# Patient Record
Sex: Female | Born: 1940 | Race: Black or African American | Hispanic: No | Marital: Single
Health system: Southern US, Community
[De-identification: ages and names within clinical notes are randomized; demographics above are authoritative.]

## PROBLEM LIST (undated history)

## (undated) DIAGNOSIS — I10 Essential (primary) hypertension: Secondary | ICD-10-CM

## (undated) HISTORY — PX: ABDOMINAL SURGERY: SHX537

---

## 2016-08-31 ENCOUNTER — Emergency Department (HOSPITAL_COMMUNITY): Payer: Self-pay

## 2016-08-31 ENCOUNTER — Encounter (HOSPITAL_COMMUNITY): Payer: Self-pay | Admitting: Emergency Medicine

## 2016-08-31 ENCOUNTER — Emergency Department (HOSPITAL_COMMUNITY)
Admission: EM | Admit: 2016-08-31 | Discharge: 2016-08-31 | Disposition: A | Discharge status: Home / Self Care | Payer: Self-pay | Attending: Emergency Medicine | Admitting: Emergency Medicine

## 2016-08-31 DIAGNOSIS — R1030 Lower abdominal pain, unspecified: Secondary | ICD-10-CM | POA: Insufficient documentation

## 2016-08-31 DIAGNOSIS — Z79899 Other long term (current) drug therapy: Secondary | ICD-10-CM | POA: Insufficient documentation

## 2016-08-31 DIAGNOSIS — I10 Essential (primary) hypertension: Secondary | ICD-10-CM | POA: Insufficient documentation

## 2016-08-31 HISTORY — DX: Essential (primary) hypertension: I10

## 2016-08-31 LAB — COMPREHENSIVE METABOLIC PANEL
ALK PHOS: 82 U/L (ref 38–126)
ALT: 18 U/L (ref 14–54)
AST: 27 U/L (ref 15–41)
Albumin: 4.1 g/dL (ref 3.5–5.0)
Anion gap: 10 (ref 5–15)
BILIRUBIN TOTAL: 1 mg/dL (ref 0.3–1.2)
BUN: 23 mg/dL — AB (ref 6–20)
CALCIUM: 9.8 mg/dL (ref 8.9–10.3)
CHLORIDE: 108 mmol/L (ref 101–111)
CO2: 24 mmol/L (ref 22–32)
CREATININE: 1.34 mg/dL — AB (ref 0.44–1.00)
GFR, EST AFRICAN AMERICAN: 43 mL/min — AB (ref >60)
GFR, EST NON AFRICAN AMERICAN: 37 mL/min — AB (ref >60)
Glucose, Bld: 98 mg/dL (ref 65–99)
Potassium: 3.8 mmol/L (ref 3.5–5.1)
Sodium: 142 mmol/L (ref 135–145)
TOTAL PROTEIN: 8 g/dL (ref 6.5–8.1)

## 2016-08-31 LAB — CBC WITH DIFFERENTIAL/PLATELET
BASOS ABS: 0 10*3/uL (ref 0.0–0.1)
Basophils Relative: 0 %
Eosinophils Absolute: 0.3 10*3/uL (ref 0.0–0.7)
Eosinophils Relative: 3 %
HEMATOCRIT: 40.9 % (ref 36.0–46.0)
HEMOGLOBIN: 13.5 g/dL (ref 12.0–15.0)
LYMPHS ABS: 3.1 10*3/uL (ref 0.7–4.0)
LYMPHS PCT: 28 %
MCH: 27.9 pg (ref 26.0–34.0)
MCHC: 33 g/dL (ref 30.0–36.0)
MCV: 84.5 fL (ref 78.0–100.0)
Monocytes Absolute: 0.5 10*3/uL (ref 0.1–1.0)
Monocytes Relative: 5 %
NEUTROS ABS: 7 10*3/uL (ref 1.7–7.7)
Neutrophils Relative %: 64 %
Platelets: 226 10*3/uL (ref 150–400)
RBC: 4.84 MIL/uL (ref 3.87–5.11)
RDW: 13.9 % (ref 11.5–15.5)
WBC: 10.8 10*3/uL — AB (ref 4.0–10.5)

## 2016-08-31 LAB — URINALYSIS, ROUTINE W REFLEX MICROSCOPIC
Bilirubin Urine: NEGATIVE
Glucose, UA: NEGATIVE mg/dL
Ketones, ur: NEGATIVE mg/dL
NITRITE: NEGATIVE
PROTEIN: NEGATIVE mg/dL
Specific Gravity, Urine: 1.004 — ABNORMAL LOW (ref 1.005–1.030)
pH: 5 (ref 5.0–8.0)

## 2016-08-31 LAB — RAPID HIV SCREEN (HIV 1/2 AB+AG)
HIV 1/2 ANTIBODIES: NONREACTIVE
HIV-1 P24 ANTIGEN - HIV24: NONREACTIVE

## 2016-08-31 LAB — I-STAT TROPONIN, ED: TROPONIN I, POC: 0.01 ng/mL (ref 0.00–0.08)

## 2016-08-31 LAB — LIPASE, BLOOD: Lipase: 21 U/L (ref 11–51)

## 2016-08-31 MED ORDER — SODIUM CHLORIDE 0.9 % IV BOLUS (SEPSIS)
1000.0000 mL | Freq: Once | INTRAVENOUS | Status: AC
  Administered 2016-08-31: 1000 mL via INTRAVENOUS

## 2016-08-31 MED ORDER — IOPAMIDOL (ISOVUE-300) INJECTION 61%
INTRAVENOUS | Status: AC
  Administered 2016-08-31: 75 mL
  Filled 2016-08-31: qty 75

## 2016-08-31 MED ORDER — LISINOPRIL 10 MG PO TABS
10.0000 mg | ORAL_TABLET | Freq: Once | ORAL | Status: AC
  Administered 2016-08-31: 10 mg via ORAL
  Filled 2016-08-31: qty 1

## 2016-08-31 MED ORDER — LISINOPRIL 10 MG PO TABS: 10.0000 mg | ORAL_TABLET | Freq: Every day | ORAL | 0 refills | Status: DC

## 2016-08-31 MED FILL — ?LISINOPRIL 10 MG TABLET: 10 | 30 days supply | Qty: 30 | Fill #0

## 2016-08-31 NOTE — ED Notes (Signed)
Patient transported to CT 

## 2016-08-31 NOTE — Discharge Instructions (Signed)
Take lisinopril 10 mg daily. [facility]   Follow up with [facility]   Return to ER if you have severe chest pain, abdominal pain, fevers, vomiting.

## 2016-08-31 NOTE — ED Provider Notes (Signed)
[facility]-EMERGENCY DEPT Provider Note   CSN: [id number] Arrival date & time: 08/31/16  0908     History   Chief Complaint Chief Complaint  Patient presents with  . Abdominal Pain    HPI Adriana Lee is a 76 y.o. female hx of HTN, previous c sections, here with hypertension, abdominal pain. Patient actually gave from [place] 3 months ago to be with her daughter. As been having chronic abdominal pain for years. States that her pain is worse in the lower abdomen. No associated vomiting or diarrhea. Patient states that her pain is progressively getting worse. Denies fevers. She has hx of HTN and has been taking BP meds but she doesn't remember what they are. Denies any chest pain.   The history is provided by the patient.    Past Medical History:  Diagnosis Date  . Hypertension     There are no active problems to display for this patient.   Past Surgical History:  Procedure Laterality Date  . ABDOMINAL SURGERY      OB History    No data available       Home Medications    Prior to Admission medications   Not on File    Family History No family history on file.  Social History Social History  Substance Use Topics  . Smoking status: Not on file  . Smokeless tobacco: Not on file  . Alcohol use Not on file     Allergies   Patient has no known allergies.   Review of Systems Review of Systems  Gastrointestinal: Positive for abdominal pain.  All other systems reviewed and are negative.    Physical Exam Updated Vital Signs BP 178/90 (BP Location: Right Arm)   Pulse 67   Temp 98.7 F (37.1 C) (Oral)   Resp 20   SpO2 100%   Physical Exam  Constitutional: She is oriented to person, place, and time. She appears well-developed.  HENT:  Head: Normocephalic.  Mouth/Throat: Oropharynx is clear and moist.  Eyes: EOM are normal. Pupils are equal, round, and reactive to light.  Neck: Normal range of motion. Neck supple.  Cardiovascular: Normal rate,  regular rhythm and normal heart sounds.   Pulmonary/Chest: Effort normal and breath sounds normal. No respiratory distress. She has no wheezes. She has no rales.  Abdominal: Soft. Bowel sounds are normal.  Mild lower abdominal tenderness, no rebound. c section scar healed   Musculoskeletal: Normal range of motion.  Neurological: She is alert and oriented to person, place, and time. No cranial nerve deficit. Coordination normal.  Skin: Skin is warm.  Psychiatric: She has a normal mood and affect.  Nursing note and vitals reviewed.    ED Treatments / Results  Labs (all labs ordered are listed, but only abnormal results are displayed) Labs Reviewed  CBC WITH DIFFERENTIAL/PLATELET  COMPREHENSIVE METABOLIC PANEL  LIPASE, BLOOD  URINALYSIS, ROUTINE W REFLEX MICROSCOPIC  HIV ANTIBODY (ROUTINE TESTING)  RAPID HIV SCREEN (HIV 1/2 AB+AG)  RPR  I-STAT TROPOININ, ED    EKG  EKG Interpretation  Date/Time:  Tuesday August 31 2016 09:35:31 EST Ventricular Rate:  68 PR Interval:    QRS Duration: 151 QT Interval:  452 QTC Calculation: 481 R Axis:   -63 Text Interpretation:  Sinus rhythm Atrial premature complex Prolonged PR interval Left bundle branch block No previous ECGs available Confirmed by YAO  MD, DAVID ([id number]) on 08/31/2016 10:01:57 AM       Radiology No results found.  Procedures Procedures (including  critical care time)  Medications Ordered in ED Medications  sodium chloride 0.9 % bolus 1,000 mL (not administered)     Initial Impression / Assessment and Plan / ED Course  I have reviewed the triage vital signs and the nursing notes.  Pertinent labs & imaging results that were available during my care of the patient were reviewed by me and considered in my medical decision making (see chart for details).     Adriana Lee is a 76 y.o. female here with lower abdominal pain, hypertension. Recently came from [place] and has no past medical visits here. Will get CT  ab/pel, labs HIV to further assess abdominal pain. Also hx of HTN but doesn't know what meds she is on. Denies chest pain or headaches or weakness. Will get labs and talk to her daughter regarding what meds she is on and about follow up.   1:34 PM I called Case management. She can get medication assistance at [facility] center but needs to be here for 3 months to get follow up. I called daughter, who is currently in [place]. She states that patient is on atenolol. BP still 180s. Will give lisinopril 10 mg daily. CT ab/pel and labs unremarkable. UA contaminated and she has no urinary symptoms so I ordered urine culture and held abx. Will have her follow up in Wellness center.   Final Clinical Impressions(s) / ED Diagnoses   Final diagnoses:  None    New Prescriptions New Prescriptions   No medications on file     Charlynne Pander, MD 08/31/16 1337

## 2016-08-31 NOTE — ED Notes (Signed)
PT taken to CT.

## 2016-08-31 NOTE — Discharge Planning (Addendum)
EDCM consulted to assist with PCP follow up for pt.  Recently came from [place] and has no past medical visits here.  Pt has to have 3 month [facility] stay to qualify for [facility] assistance and [facility] discounts.  [facility] met with pt at bedside; Pt speaks very little [place]. EDCM willinterview pt when family member arrives as the [employer] was a cumbersome for pt.

## 2016-08-31 NOTE — ED Notes (Signed)
Care handoff to Dustin RN 

## 2016-08-31 NOTE — ED Notes (Signed)
Patient returned from ct

## 2016-08-31 NOTE — ED Notes (Signed)
Patient given turkey sandwich and coke.  

## 2016-08-31 NOTE — ED Notes (Signed)
Lab calls and reports that they need another gold tube for PT

## 2016-08-31 NOTE — ED Notes (Signed)
Patient transported to X-ray 

## 2016-09-01 LAB — RPR: RPR: NONREACTIVE

## 2016-09-01 LAB — HIV ANTIBODY (ROUTINE TESTING W REFLEX): HIV Screen 4th Generation wRfx: NONREACTIVE

## 2016-09-01 LAB — URINE CULTURE

## 2016-09-02 ENCOUNTER — Ambulatory Visit: Payer: Self-pay | Attending: Internal Medicine | Admitting: Physician Assistant

## 2016-09-02 VITALS — BP 135/73 | HR 62 | Temp 97.6°F | Resp 16 | Wt 145.0 lb

## 2016-09-02 DIAGNOSIS — I129 Hypertensive chronic kidney disease with stage 1 through stage 4 chronic kidney disease, or unspecified chronic kidney disease: Secondary | ICD-10-CM | POA: Insufficient documentation

## 2016-09-02 DIAGNOSIS — G8929 Other chronic pain: Secondary | ICD-10-CM | POA: Insufficient documentation

## 2016-09-02 DIAGNOSIS — I10 Essential (primary) hypertension: Secondary | ICD-10-CM

## 2016-09-02 DIAGNOSIS — N183 Chronic kidney disease, stage 3 (moderate): Secondary | ICD-10-CM | POA: Insufficient documentation

## 2016-09-02 DIAGNOSIS — R1030 Lower abdominal pain, unspecified: Secondary | ICD-10-CM

## 2016-09-02 DIAGNOSIS — I700 Atherosclerosis of aorta: Secondary | ICD-10-CM | POA: Insufficient documentation

## 2016-09-02 DIAGNOSIS — K5900 Constipation, unspecified: Secondary | ICD-10-CM | POA: Insufficient documentation

## 2016-09-02 DIAGNOSIS — Z9889 Other specified postprocedural states: Secondary | ICD-10-CM | POA: Insufficient documentation

## 2016-09-02 DIAGNOSIS — Z79899 Other long term (current) drug therapy: Secondary | ICD-10-CM | POA: Insufficient documentation

## 2016-09-02 DIAGNOSIS — R109 Unspecified abdominal pain: Secondary | ICD-10-CM | POA: Insufficient documentation

## 2016-09-02 DIAGNOSIS — I447 Left bundle-branch block, unspecified: Secondary | ICD-10-CM

## 2016-09-02 DIAGNOSIS — I7090 Unspecified atherosclerosis: Secondary | ICD-10-CM

## 2016-09-02 MED ORDER — HYDROCHLOROTHIAZIDE 25 MG PO TABS: 25.0000 mg | ORAL_TABLET | Freq: Every day | ORAL | 0 refills | Status: AC

## 2016-09-02 MED ORDER — LISINOPRIL 10 MG PO TABS: 10.0000 mg | ORAL_TABLET | Freq: Every day | ORAL | 0 refills | Status: AC

## 2016-09-02 MED ORDER — ATORVASTATIN CALCIUM 20 MG PO TABS: 20.0000 mg | ORAL_TABLET | Freq: Every day | ORAL | 0 refills | Status: AC

## 2016-09-02 MED FILL — HYDROCHLOROTHIAZIDE 25 MG T: 25 | 30 days supply | Qty: 30 | Fill #0

## 2016-09-02 MED FILL — ATORVASTATIN 20 MG TABLET: 20 | 30 days supply | Qty: 30 | Fill #0

## 2016-09-02 NOTE — Progress Notes (Signed)
Adriana Lee  [id number]  GMW:[id number]  DOB - 1940/09/28  Chief Complaint  Patient presents with  . Follow-up  . Hypertension  . Abdominal Pain       Subjective:   Adriana Lee is a 76 y.o. female here today for establishment of care. She is from [place] and has been in [place] for 6 months. She has a history of hypertension, chronic kidney disease stage III, and chronic abdominal pain. She presented to the emergency department on 08/31/2016 with acute on chronic abdominal pain, worsened over the last 3 months. No nausea or vomiting. Had some constipation but better with laxatives. Noted to have a blood pressure elevated 178/90. She was only taking hydrochlorothiazide when she presented to the emergency department. Her troponin was negative. Her RPR and HIV testing were negative. Her lipase was negative. Her creatinine was 1.3. An EKG showed a junctional rhythm with a left bundle branch block and no acute changes. 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  She was given symptomatic relief of the abdominal pain. She was started lisinopril for the blood pressure. She was told to follow-up here. She states that her belly has improved. She's been compliant with her blood pressure medications. Her reading today is much improved. No headaches. No nausea or vomiting. Having regular bowel movements. No chest pain. No syncope. No shortness of breath.   ROS: GEN: denies fever or chills, denies change in weight Skin: denies lesions or rashes HEENT: occasional headache,no earache, epistaxis, sore throat, or neck pain LUNGS: denies SHOB, dyspnea, PND, orthopnea CV: denies CP or palpitations ABD: + abd pain, N or V EXT: denies muscle spasms or swelling; no pain in lower ext, no weakness; +back painNEURO: denies numbness or tingling, denies sz, stroke or TIA   ALLERGIES: No Known Allergies  PAST MEDICAL HISTORY: Past  Medical History:  Diagnosis Date  . Hypertension     PAST SURGICAL HISTORY: Past Surgical History:  Procedure Laterality Date  . ABDOMINAL SURGERY      MEDICATIONS AT HOME: Prior to Admission medications   Medication Sig Start Date End Date Taking? Authorizing Provider  hydrochlorothiazide (HYDRODIURIL) 25 MG tablet Take 1 tablet (25 mg total) by mouth daily. 09/02/16  Yes Burlin Mcnair Netta Cedars, PA-C  lisinopril (PRINIVIL,ZESTRIL) 10 MG tablet Take 1 tablet (10 mg total) by mouth daily. 09/02/16  Yes Izaah Westman Netta Cedars, PA-C  atorvastatin (LIPITOR) 20 MG tablet Take 1 tablet (20 mg total) by mouth daily. Patient not taking: Reported on 09/02/2016 09/02/16   Vivianne Master, PA-C     Objective:   Vitals:   09/02/16 1052  BP: 135/73  Pulse: 62  Resp: 16  Temp: 97.6 F (36.4 C)  TempSrc: Oral  SpO2: 97%  Weight: 145 lb (65.8 kg)    Exam-benign General appearance : Awake, alert, not in any distress. Speech Clear. Not toxic looking HEENT: Atraumatic and Normocephalic, pupils equally reactive to light and accomodation Neck: supple, no JVD. No cervical lymphadenopathy.  Chest:Good air entry bilaterally, no added sounds  CVS: S1 S2 regular, no murmurs.  Abdomen: Bowel sounds present, Non tender and not distended with no guarding, rigidity or rebound. Extremities: B/L Lower Ext shows no edema, both legs are warm to touch Neurology: Awake alert, and oriented X 3, CN II-XII intact, Non focal Skin:No Rash Wounds:N/A   Assessment & Plan  1. HTN  -DASH diet  -Cont ACE/HCTZ (refilled)   -  labs in 1-2 weeks 2. Aortic atherosclerosis/abn EKG (LBBB)  -Cards referral for possible echo and stress test  -RF modification  -Add statin  -Add ASA at next visit 3. Acute on chronic abd pain-imaging reassuring  -cont with sx relief   Return in about 2 weeks (around 09/16/2016).  The patient was given clear instructions to go to ER or return to medical center if symptoms don't improve, worsen or new  problems develop. The patient verbalized understanding. The patient was told to call to get lab results if they haven't heard anything in the next week.   This note has been created with Education officer, environmentalDragon speech recognition software and smart phrase technology. Any transcriptional errors are unintentional.    Scot Juniffany Mauri Tolen, PA-C [facility] [place], [place] [phone]   09/02/2016, 11:48 AM

## 2018-07-06 IMAGING — DX DG CHEST 2V
2 series · 2 of 2 positions shown · non-contrast
Comparison: None.

CLINICAL DATA: Hypertension and nausea beginning today.

EXAM:
CHEST  2 VIEW

[w chest pa]
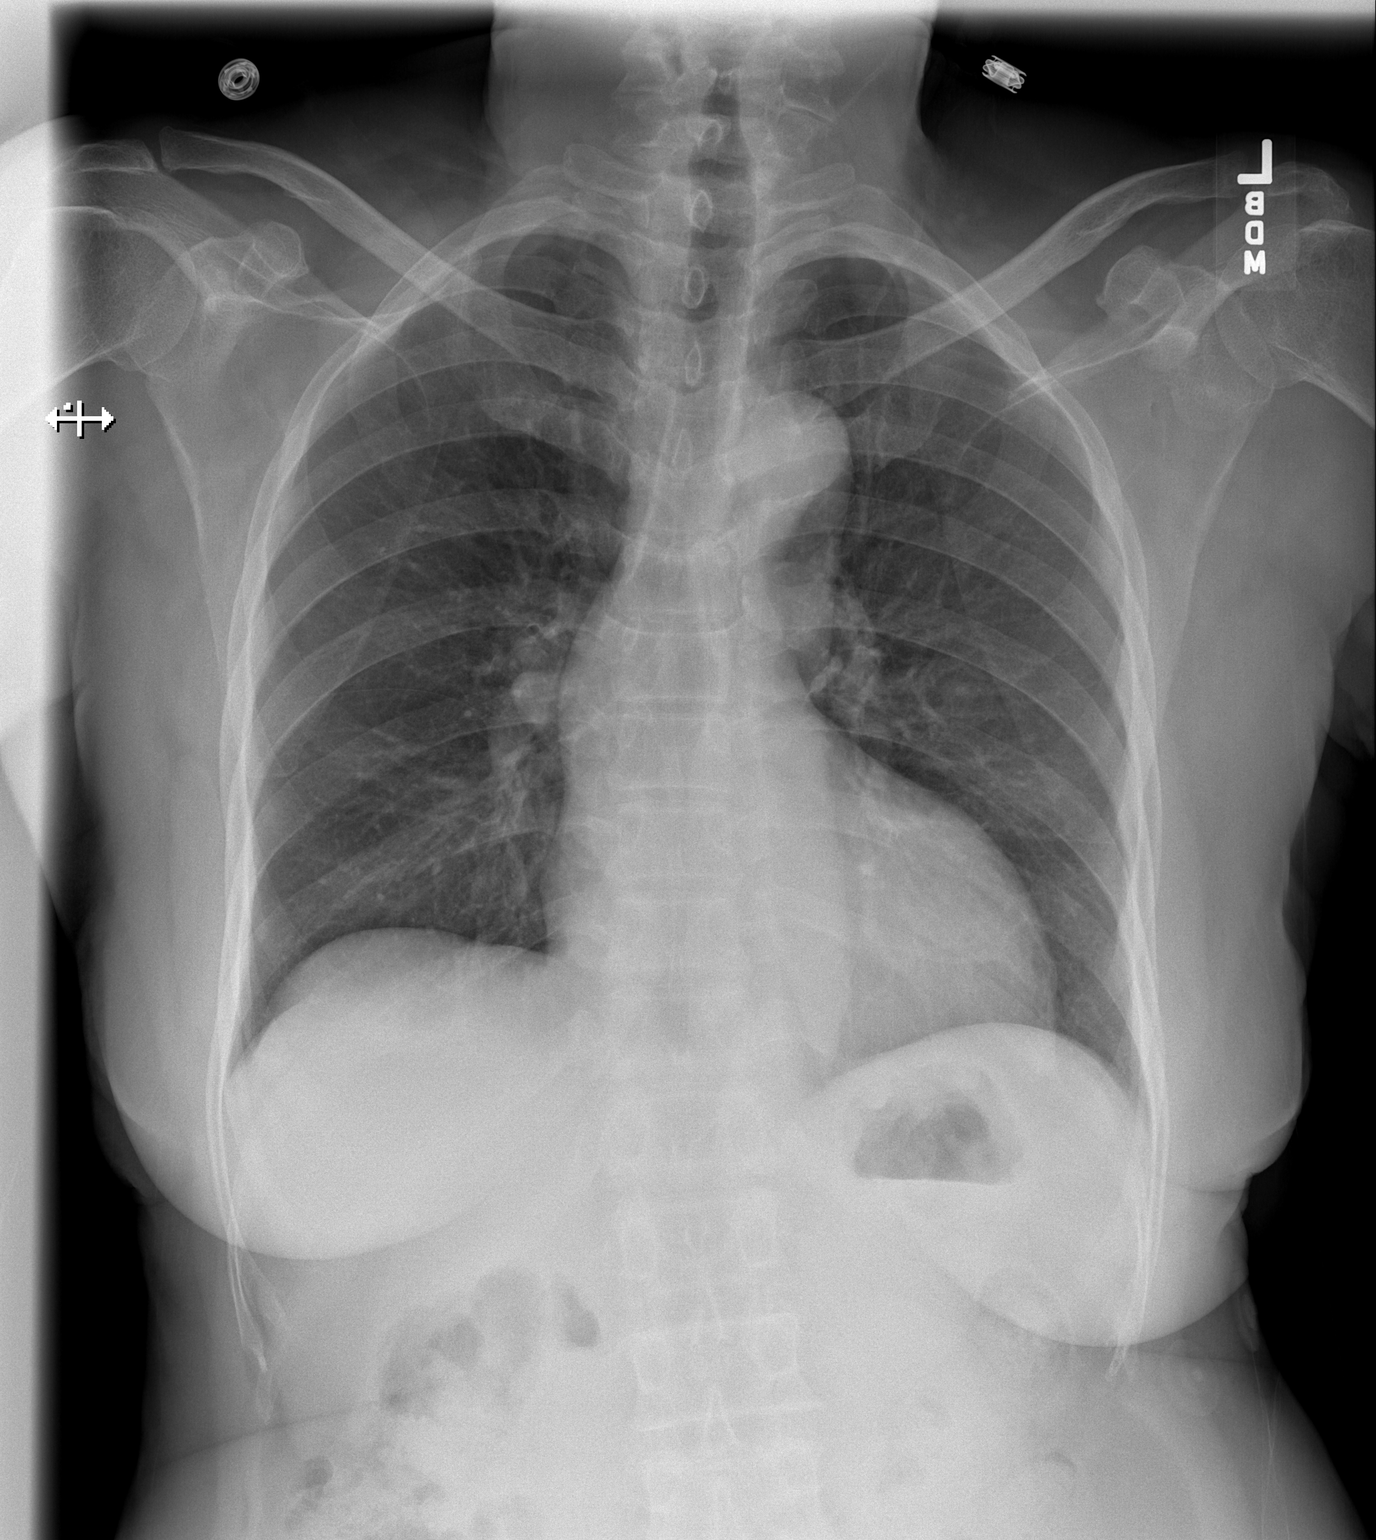

[w chest lat]
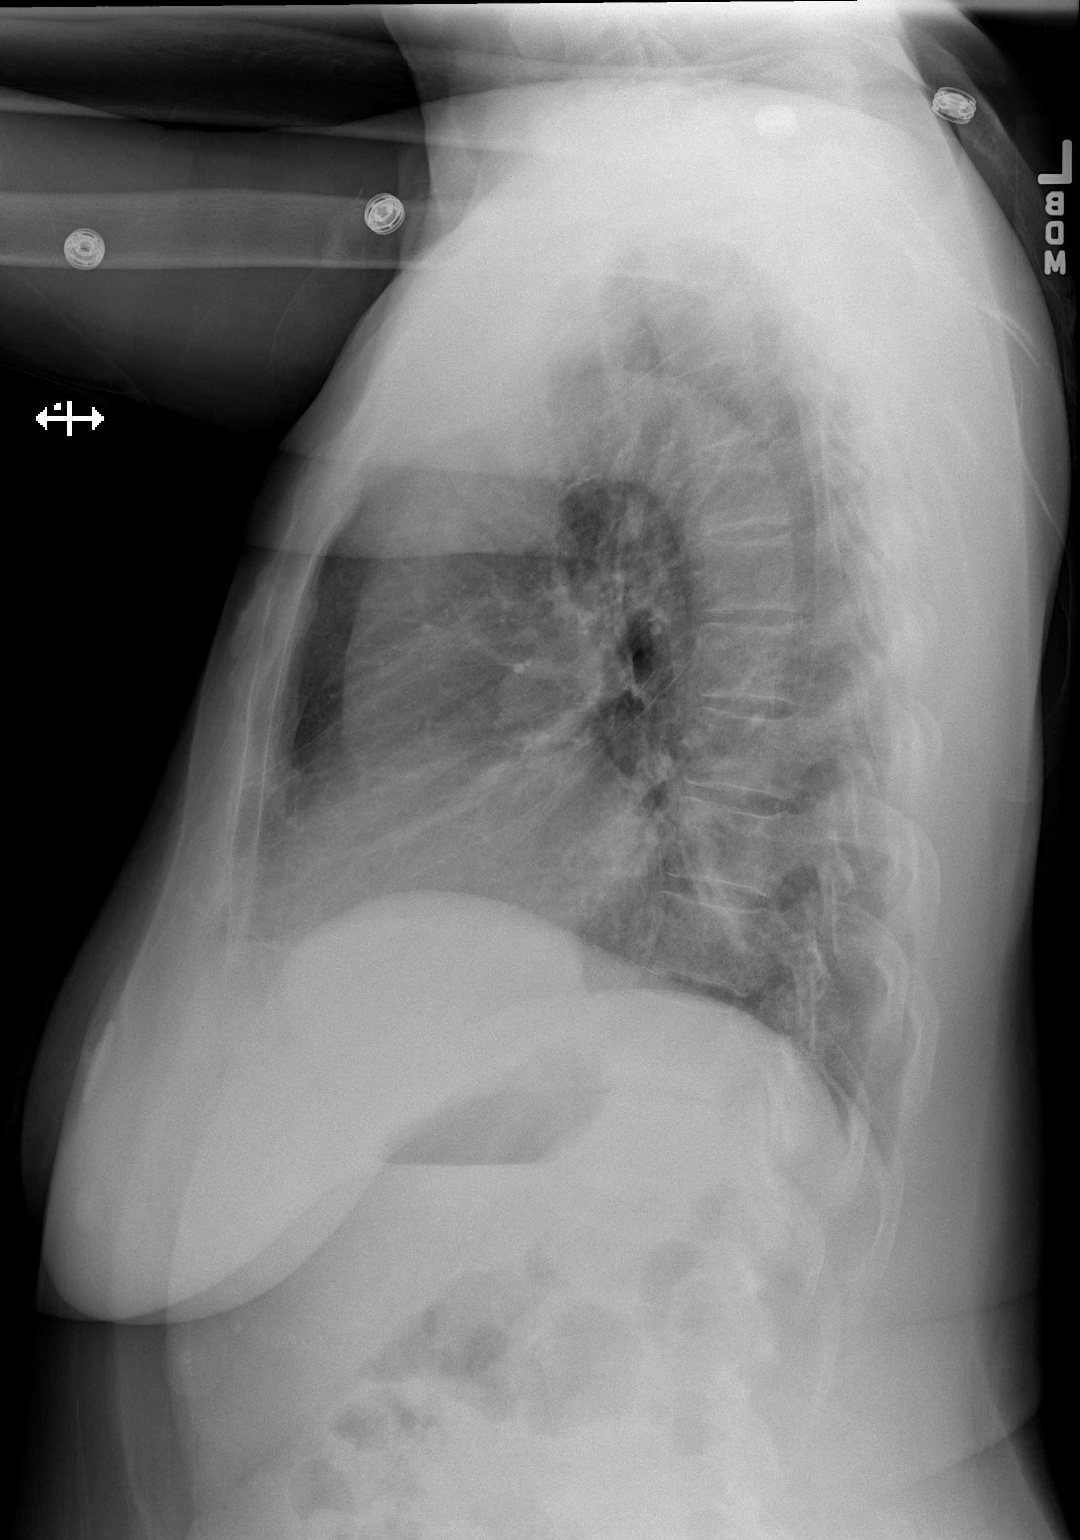

[2 of 2 positions shown; findings below may reference images not displayed]

FINDINGS: Borderline cardiomegaly. Mild ectasia and atherosclerotic
calcification of thoracic aorta noted. Both lungs are clear.
IMPRESSION: Borderline cardiomegaly.  No active lung disease.

Aortic atherosclerosis.

## 2018-07-06 IMAGING — CT CT ABD-PELV W/ CM
2 of 5 series · 16 of 46 positions shown, 18 images · IV contrast (Omni 300)
Comparison: None.

CLINICAL DATA: 76-year-old hypertensive female with abdominal and
back pain for several days. Initial encounter.

EXAM:
CT ABDOMEN AND PELVIS WITH CONTRAST
TECHNIQUE: Multidetector CT imaging of the abdomen and pelvis was performed
using the standard protocol following bolus administration of
intravenous contrast.
CONTRAST:  75mL FQ27EV-ZFF IOPAMIDOL (FQ27EV-ZFF) INJECTION 61%

[Series 2: a/p w/ 5mm · axial · 0.72mm/px · z∈[+1028,+1398]mm · 13 of 84 slices shown, 15 images]
[im 5/84  soft-tissue]
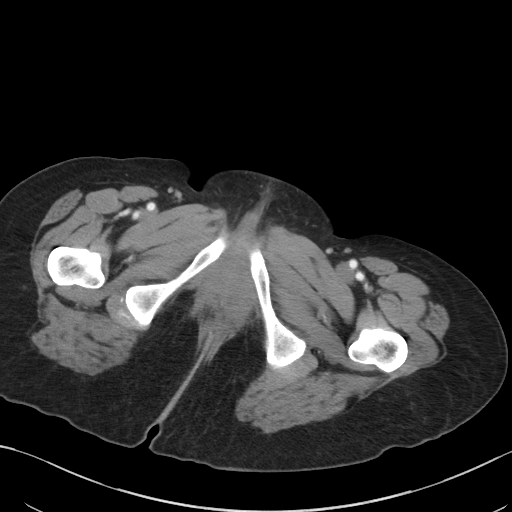
[im 5/84  bone]
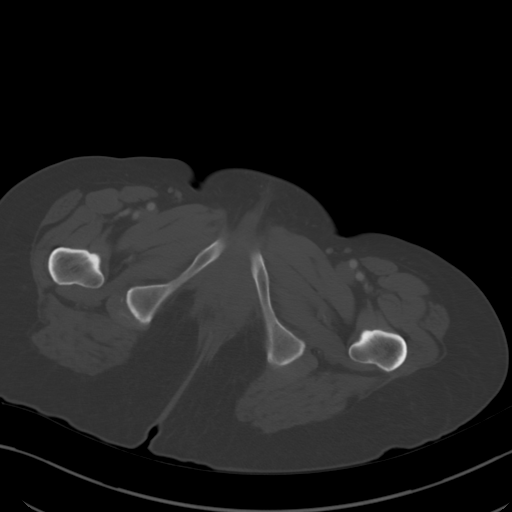
[im 13/84  soft-tissue]
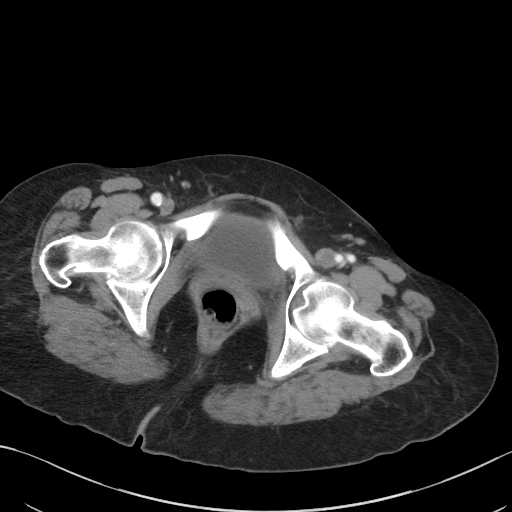
[im 17/84  soft-tissue]
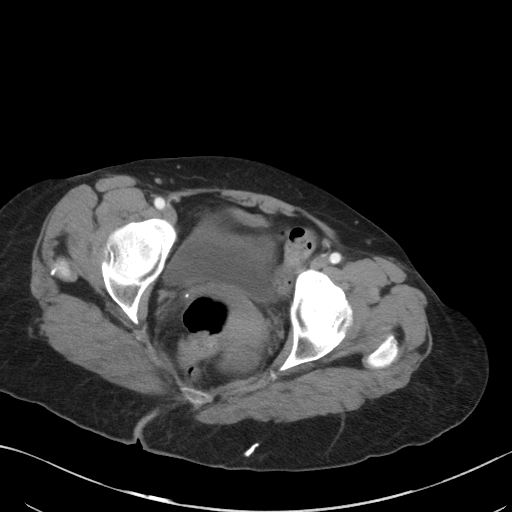
[im 25/84  soft-tissue]
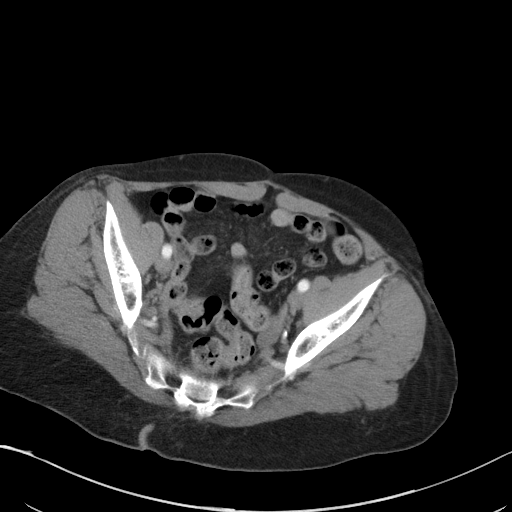
[im 30/84  soft-tissue]
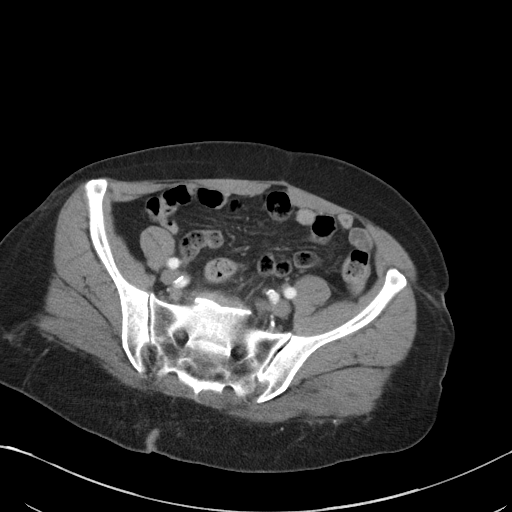
[im 38/84  soft-tissue]
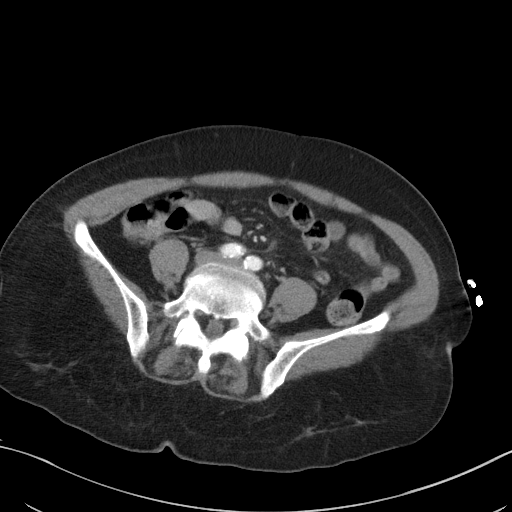
[im 42/84  soft-tissue]
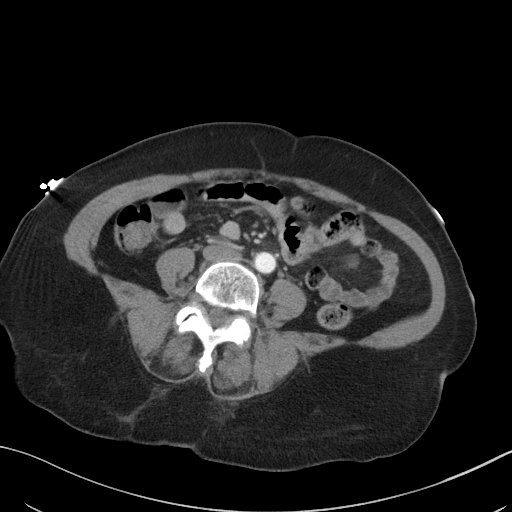
[im 46/84  soft-tissue]
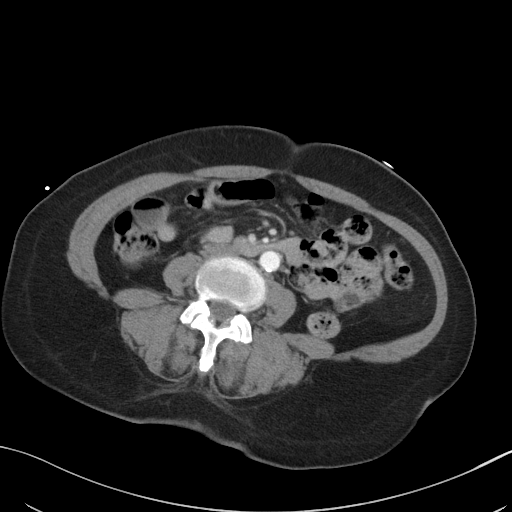
[im 54/84  soft-tissue]
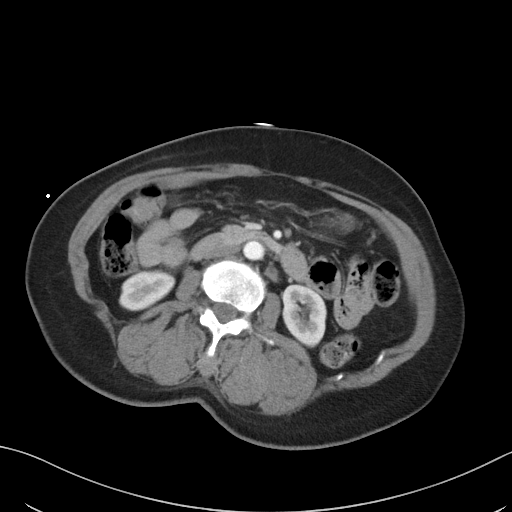
[im 54/84  bone]
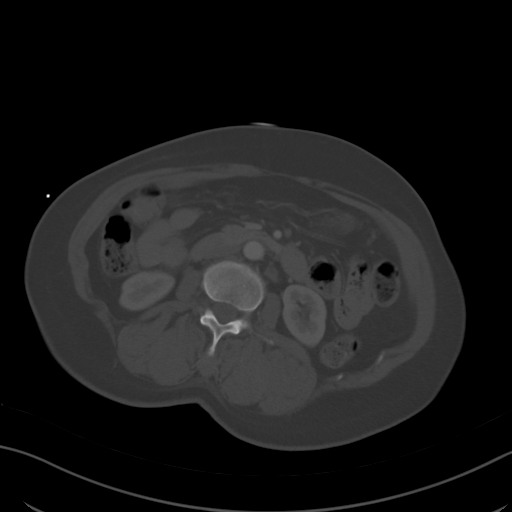
[im 59/84  soft-tissue]
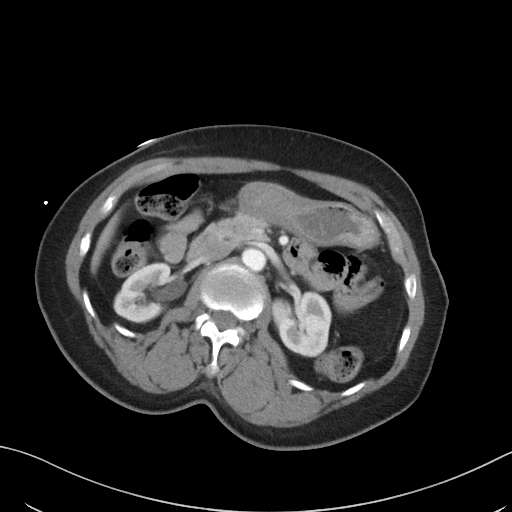
[im 67/84  soft-tissue]
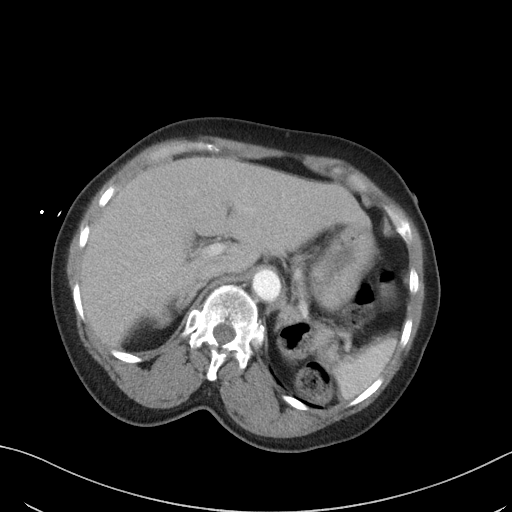
[im 71/84  soft-tissue]
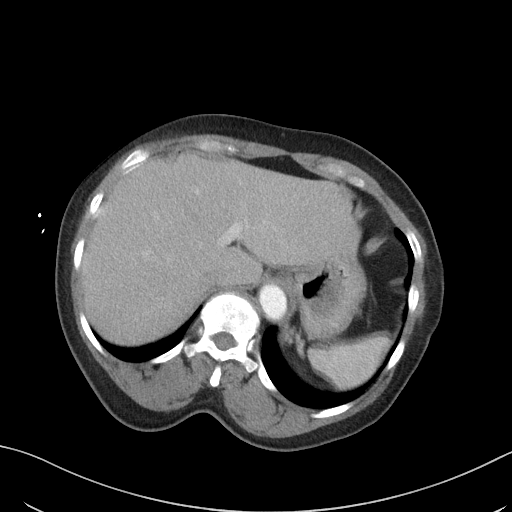
[im 79/84  soft-tissue]
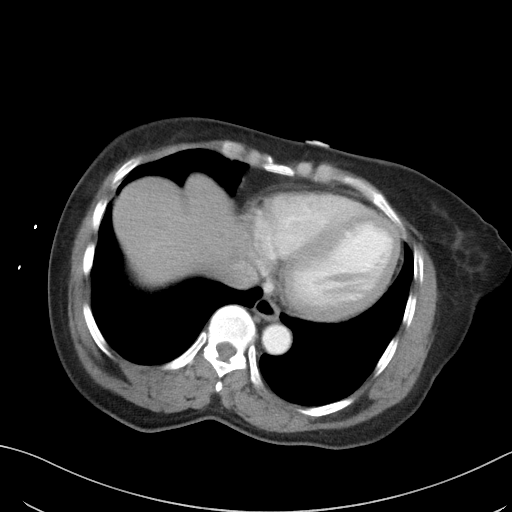

[Series 5: a/p w/ cor · coronal · 0.81mm/px · 3 of 149 slices shown]
[im 50/149  soft-tissue]
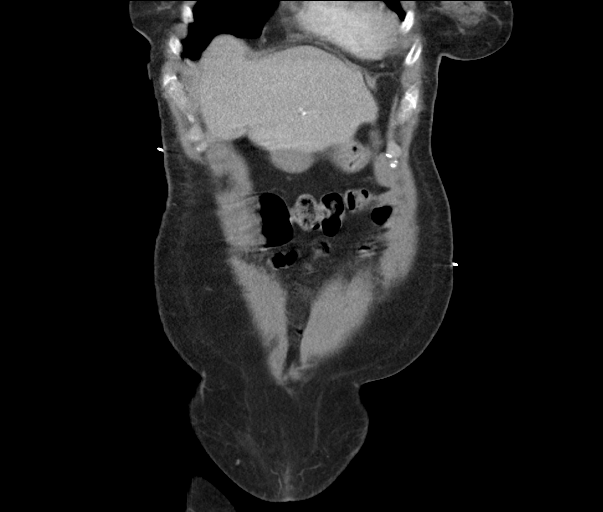
[im 66/149  soft-tissue]
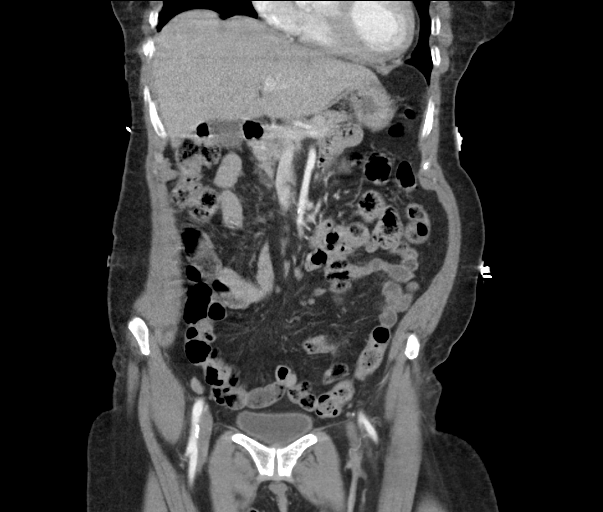
[im 83/149  soft-tissue]
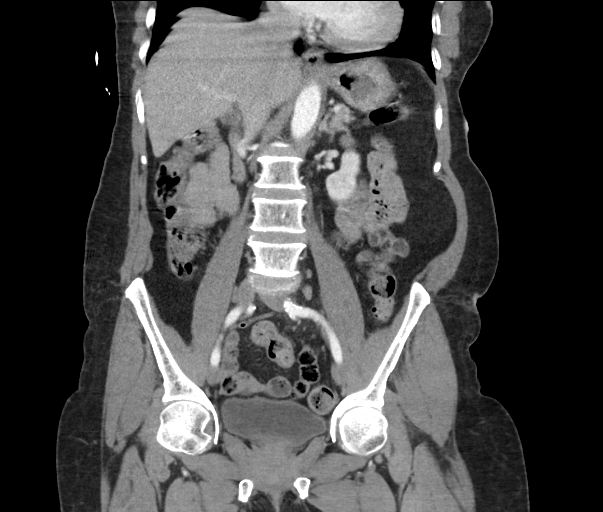

[16 of 46 positions shown; findings below may reference images not displayed]

FINDINGS: Exam is slightly motion degraded.

Lower chest: Lung bases clear. Heart slightly enlarged. Dense left
breast parenchyma incompletely assessed.

Hepatobiliary: Fatty liver. Coarse nonspecific small calcifications
left lobe liver. No worrisome hepatic lesion. No calcified
gallstone.

Pancreas: No mass or inflammation.

Spleen: No mass or enlargement.

Adrenals/Urinary Tract: No renal or ureteral obstructing stone or
evidence hydronephrosis. Left renal subcentimeter low-density
structure possibly a cyst but too small to characterize. No adrenal
lesion. Noncontrast filled views of the urinary bladder
unremarkable.

Stomach/Bowel: Portions bowel under distended and evaluation
limited. No extraluminal bowel inflammatory process, free fluid or
free air.

Vascular/Lymphatic: Atherosclerotic changes of the aorta without
aneurysm or large vessel occlusion. No adenopathy. Elongated normal
size retroperitoneal lymph nodes.

Reproductive: Retroverted uterus tilted towards left containing
cm fibroid. No worrisome adnexal mass.

Other: Injection granulomas incidentally noted.

Musculoskeletal: Degenerative changes lower lumbar spine most
notable L4-5 and L5-S1.
IMPRESSION: Exam is motion degraded.

Portions of bowel under distended and therefore evaluation limited.
No extraluminal bowel inflammatory process, free fluid or free air.

Fatty liver containing small nonspecific coarse calcifications left
lobe of liver.

Aortic atherosclerosis.

Degenerative changes lower lumbar spine most notable L4-5 and L5-S1.

Retroverted uterus tilted towards left containing 1.4 cm fibroid.
